# Patient Record
Sex: Male | Born: 1940 | Race: Black or African American | Hispanic: No
Health system: Southern US, Community
[De-identification: ages and names within clinical notes are randomized; demographics above are authoritative.]

## PROBLEM LIST (undated history)

## (undated) DIAGNOSIS — M48061 Spinal stenosis, lumbar region without neurogenic claudication: Secondary | ICD-10-CM

## (undated) DIAGNOSIS — E785 Hyperlipidemia, unspecified: Secondary | ICD-10-CM

## (undated) DIAGNOSIS — D649 Anemia, unspecified: Secondary | ICD-10-CM

## (undated) DIAGNOSIS — C450 Mesothelioma of pleura: Secondary | ICD-10-CM

## (undated) DIAGNOSIS — I10 Essential (primary) hypertension: Secondary | ICD-10-CM

## (undated) HISTORY — PX: HIP SURGERY: SHX245

## (undated) HISTORY — DX: Hyperlipidemia, unspecified: E78.5

## (undated) HISTORY — DX: Mesothelioma of pleura: C45.0

## (undated) HISTORY — DX: Anemia, unspecified: D64.9

## (undated) HISTORY — DX: Essential (primary) hypertension: I10

## (undated) HISTORY — DX: Spinal stenosis, lumbar region without neurogenic claudication: M48.061

---

## 2015-06-27 ENCOUNTER — Encounter: Payer: Self-pay | Admitting: Internal Medicine

## 2015-06-27 ENCOUNTER — Non-Acute Institutional Stay (SKILLED_NURSING_FACILITY): Payer: Medicare Other | Admitting: Internal Medicine

## 2015-06-27 DIAGNOSIS — D62 Acute posthemorrhagic anemia: Secondary | ICD-10-CM | POA: Diagnosis not present

## 2015-06-27 DIAGNOSIS — C450 Mesothelioma of pleura: Secondary | ICD-10-CM

## 2015-06-27 DIAGNOSIS — E785 Hyperlipidemia, unspecified: Secondary | ICD-10-CM | POA: Insufficient documentation

## 2015-06-27 DIAGNOSIS — M48061 Spinal stenosis, lumbar region without neurogenic claudication: Secondary | ICD-10-CM

## 2015-06-27 DIAGNOSIS — I10 Essential (primary) hypertension: Secondary | ICD-10-CM | POA: Insufficient documentation

## 2015-06-27 DIAGNOSIS — M4806 Spinal stenosis, lumbar region: Secondary | ICD-10-CM | POA: Diagnosis not present

## 2015-06-27 DIAGNOSIS — K920 Hematemesis: Secondary | ICD-10-CM | POA: Diagnosis not present

## 2015-06-27 DIAGNOSIS — J910 Malignant pleural effusion: Secondary | ICD-10-CM

## 2015-06-27 NOTE — Progress Notes (Signed)
MRN: [id number] Name: Harold Daniels  Sex: male Age: 74 y.o. DOB: 04-Aug-1941  [place] #: [name] Facility/Room:108 Level Of Care: SNF Provider: Inocencio Homes D Emergency Contacts: Extended Emergency Contact Information Primary Emergency Contact: Contact,No  [place] Phone: [phone] Relation: None  Code Status:   Allergies: Review of patient's allergies indicates not on file.  Chief Complaint  Patient presents with  . New Admit To SNF    HPI: Patient is 74 y.o. male who has metastatic mesothelioma who  is admitted to SNF for OT/PT for lower extremity weakness and who was just hosp for hematemesis with neg w/u.  Past Medical History  Diagnosis Date  . Mesothelioma (pleural)   . Lumbar spinal stenosis   . Anemia   . Hyperlipidemia   . Hypertension     History reviewed. No pertinent past surgical history.    Medication List       This list is accurate as of: 06/27/15  7:16 PM.  Always use your most recent med list.               acetaminophen 650 MG CR tablet  Commonly known as:  TYLENOL  Take 650 mg by mouth every 8 (eight) hours as needed for pain.     atorvastatin 40 MG tablet  Commonly known as:  LIPITOR  Take 40 mg by mouth daily.     docusate sodium 100 MG capsule  Commonly known as:  COLACE  Take 100 mg by mouth every other day.     ENSURE PLUS Liqd  Take 237 mLs by mouth 2 (two) times daily between meals.     folic acid 1 MG tablet  Commonly known as:  FOLVITE  Take 1 mg by mouth daily.     mirtazapine 7.5 MG tablet  Commonly known as:  REMERON  Take 7.5 mg by mouth at bedtime.     multivitamin tablet  Take 1 tablet by mouth daily.     oxycodone 5 MG capsule  Commonly known as:  OXY-IR  Take 5 mg by mouth every 4 (four) hours as needed.     pantoprazole 40 MG tablet  Commonly known as:  PROTONIX  Take 40 mg by mouth daily.        Meds ordered this encounter  Medications  . pantoprazole (PROTONIX) 40 MG  tablet    Sig: Take 40 mg by mouth daily.  Marland Kitchen oxycodone (OXY-IR) 5 MG capsule    Sig: Take 5 mg by mouth every 4 (four) hours as needed.  Marland Kitchen acetaminophen (TYLENOL) 650 MG CR tablet    Sig: Take 650 mg by mouth every 8 (eight) hours as needed for pain.  Marland Kitchen atorvastatin (LIPITOR) 40 MG tablet    Sig: Take 40 mg by mouth daily.  Marland Kitchen docusate sodium (COLACE) 100 MG capsule    Sig: Take 100 mg by mouth every other day.  . folic acid (FOLVITE) 1 MG tablet    Sig: Take 1 mg by mouth daily.  Marland Kitchen ENSURE PLUS (ENSURE PLUS) LIQD    Sig: Take 237 mLs by mouth 2 (two) times daily between meals.  . mirtazapine (REMERON) 7.5 MG tablet    Sig: Take 7.5 mg by mouth at bedtime.  . Multiple Vitamin (MULTIVITAMIN) tablet    Sig: Take 1 tablet by mouth daily.     There is no immunization history on file for this patient.  History  Substance Use Topics  . Smoking status: Unknown If  Ever Smoked  . Smokeless tobacco: Not on file  . Alcohol Use: Not on file    Family history is noncontributory    Review of Systems  DATA OBTAINED: from patient, nurse GENERAL:  no fevers, fatigue, appetite changes SKIN: No itching, rash or wounds EYES: No eye pain, redness, discharge EARS: No earache, tinnitus, change in hearing NOSE: No congestion, drainage or bleeding  MOUTH/THROAT: No mouth or tooth pain, No sore throat RESPIRATORY: No cough, wheezing, SOB CARDIAC: No chest pain, palpitations, lower extremity edema  GI: No abdominal pain, No N/V/D or constipation, No heartburn or reflux  GU: No dysuria, frequency or urgency, or incontinence  MUSCULOSKELETAL: No unrelieved bone/joint pain NEUROLOGIC: No headache, dizziness or focal weakness PSYCHIATRIC: No overt anxiety or sadness, No behavior issue.   Filed Vitals:   06/27/15 1847  BP: 126/72  Pulse: 71  Temp: 97.1 F (36.2 C)  Resp: 18    Physical Exam  GENERAL APPEARANCE: Alert, conversant, BM, No acute distress.  SKIN: No diaphoresis rash HEAD:  Normocephalic, atraumatic  EYES: Conjunctiva/lids clear. Pupils round, reactive. EOMs intact.  EARS: External exam WNL, canals clear. Hearing grossly normal.  NOSE: No deformity or discharge.  MOUTH/THROAT: Lips w/o lesions  RESPIRATORY: Breathing is even, unlabored. Lung sounds are clear but shallow CARDIOVASCULAR: Heart RRR no murmurs, rubs or gallops. No peripheral edema.   GASTROINTESTINAL: Abdomen is soft, non-tender, not distended w/ normal bowel sounds. GENITOURINARY: Bladder non tender, not distended  MUSCULOSKELETAL: No abnormal joints or musculature NEUROLOGIC:  Cranial nerves 2-12 grossly intact. Moves all extremities  PSYCHIATRIC: Mood and affect appropriate to situation, no behavioral issues  Patient Active Problem List   Diagnosis Date Noted  . Malignant pleural effusion 06/27/2015  . Hematemesis 06/27/2015  . Mesothelioma (pleural)   . Lumbar spinal stenosis   . Acute blood loss anemia   . Hyperlipidemia   . Hypertension         Assessment and Plan  Mesothelioma (pleural) METASTATIC; CT chest/abd/pelvis suggested advancing disease; chronically indwelling chest tube to drain recurrent malignant pleural effusion; pt has asked for care be transferred to 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

## 2015-06-27 NOTE — Assessment & Plan Note (Addendum)
METASTATIC; CT chest/abd/pelvis suggested advancing disease; chronically indwelling chest tube to drain recurrent malignant pleural effusion; pt has asked for care be transferred to [facility] and has f/u with Dr Mindi Junker

## 2015-06-27 NOTE — Assessment & Plan Note (Signed)
Neurology evaled 2/2 hx LE weakness and frequent falls; CT - compression fx of T3T4, indeterminant age, and possibly new T3 canal stenosis; Lumbar MRI with severe spinal stenosis at [facility]; he was treated with tylenol and PT

## 2015-06-27 NOTE — Assessment & Plan Note (Signed)
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

## 2015-06-27 NOTE — Assessment & Plan Note (Addendum)
2/2 hematemesis;Pt Hb dropped from 13 to mid 10's; there was sign of bleeding found;pt was placed on PPI

## 2015-06-27 NOTE — Assessment & Plan Note (Signed)
Continue lipitor  ?

## 2015-07-26 ENCOUNTER — Encounter: Payer: Self-pay | Admitting: Internal Medicine

## 2015-07-26 ENCOUNTER — Non-Acute Institutional Stay (SKILLED_NURSING_FACILITY): Payer: Medicare Other | Admitting: Internal Medicine

## 2015-07-26 DIAGNOSIS — C450 Mesothelioma of pleura: Secondary | ICD-10-CM | POA: Diagnosis not present

## 2015-07-26 DIAGNOSIS — D62 Acute posthemorrhagic anemia: Secondary | ICD-10-CM

## 2015-07-26 DIAGNOSIS — J910 Malignant pleural effusion: Secondary | ICD-10-CM

## 2015-07-26 NOTE — Progress Notes (Signed)
Patient ID: Harold Daniels, male   DOB: Apr 21, 1941, 74 y.o.   MRN: [id number] MRN: [id number] Name: Harold Daniels  Sex: male Age: 74 y.o. DOB: Apr 07, 1941  [place] [facility] Facility/Room:108 Level Of Care: SNF Provider: Wille Celeste Emergency Contacts: Extended Emergency Contact Information Primary Emergency Contact: Contact,No  [place] Phone: [phone] Relation: None  Code Status:   Allergies: Review of patient's allergies indicates not on file.  Chief Complaint  Patient presents with  . Discharge Note    HPI: Patient is 74 y.o. male who has metastatic mesothelioma who  is admitted to SNF for OT/PT for lower extremity weakness and who was just hosp for hematemesis with neg w/u.--His stay here has been relatively unremarkable he has no complaints today he is slated to go home-he does have a supportive family-today he has no acute complaints He has gained strength but will need continued PT and OT for strengthening  Past Medical History  Diagnosis Date  . Mesothelioma (pleural)   . Lumbar spinal stenosis   . Anemia   . Hyperlipidemia   . Hypertension     No past surgical history on file.    Medication List       This list is accurate as of: 07/26/15 11:59 PM.  Always use your most recent med list.               acetaminophen 650 MG CR tablet  Commonly known as:  TYLENOL  Take 650 mg by mouth every 8 (eight) hours as needed for pain.     atorvastatin 40 MG tablet  Commonly known as:  LIPITOR  Take 40 mg by mouth daily.     docusate sodium 100 MG capsule  Commonly known as:  COLACE  Take 100 mg by mouth every other day.     ENSURE PLUS Liqd  Take 237 mLs by mouth 2 (two) times daily between meals.     folic acid 1 MG tablet  Commonly known as:  FOLVITE  Take 1 mg by mouth daily.     mirtazapine 7.5 MG tablet  Commonly known as:  REMERON  Take 7.5 mg by mouth at bedtime.     multivitamin tablet  Take 1 tablet by mouth daily.     oxycodone 5 MG capsule  Commonly known as:  OXY-IR  Take 5 mg by mouth every 4 (four) hours as needed.     pantoprazole 40 MG tablet  Commonly known as:  PROTONIX  Take 40 mg by mouth daily.            Social History  Substance Use Topics  . Smoking status: Unknown If Ever Smoked  . Smokeless tobacco: Not on file  . Alcohol Use: Not on file    Family history is noncontributory    Review of Systems  DATA OBTAINED: from patient, nurse GENERAL:  no fevers, fatigue, appetite changes SKIN: No itching, rash or wounds EYES: No eye pain, redness, discharge EARS: No earache, tinnitus, change in hearing NOSE: No congestion, drainage or bleeding  MOUTH/THROAT: No mouth or tooth pain, No sore throat RESPIRATORY: No cough, wheezing, SOB CARDIAC: No chest pain, palpitations, lower extremity edema  GI: No abdominal pain, No N/V/D or constipation, No heartburn or reflux  GU: No dysuria, frequency or urgency, or incontinence  MUSCULOSKELETAL: No unrelieved bone/joint pain--has some continued weakness NEUROLOGIC: No headache, dizziness or focal weakness PSYCHIATRIC: No overt anxiety or sadness, No behavior issue.   Filed Vitals:  07/26/15 1731  BP: 100/65  Pulse: 56  Temp: 97.2 F (36.2 C)  Resp: 18    Physical Exam  GENERAL APPEARANCE: Alert, conversant, BM, No acute distress.  SKIN: No diaphoresis rash HEAD: Normocephalic, atraumatic  EYES: Conjunctiva/lids clear. Pupils round, reactive. EOMs intact.  EARS: External exam WNL, canals clear. Hearing grossly normal.  NOSE: No deformity or discharge.  MOUTH/THROAT: Oropharynx is clear mucous membranes moist  RESPIRATORY: Breathing is even, unlabored. Lung sounds are clear but shallow CARDIOVASCULAR: Heart RRR with occasional skipped beats no murmurs, rubs or gallops. No peripheral edema.   GASTROINTESTINAL: Abdomen is soft, non-tender, not distended w/ normal bowel sounds. GENITOURINARY: Bladder non tender, not distended   MUSCULOSKELETAL: No abnormal joints or musculature. Does ambulate with a walker but is quite weak he is kyphotic NEUROLOGIC:  Cranial nerves 2-12 grossly intact. Moves all extremities  PSYCHIATRIC: Mood and affect appropriate to situation, no behavioral issues  Patient Active Problem List   Diagnosis Date Noted  . Malignant pleural effusion 06/27/2015  . Hematemesis 06/27/2015  . Mesothelioma (pleural)   . Lumbar spinal stenosis   . Acute blood loss anemia   . Hyperlipidemia   . Hypertension     Labs  06/19/2015.  Sodium 139 potassium 3.9 BUN 12 creatinine 1.1.  CBC 3.6 hemoglobin 10.7 platelets 233.  06/13/2015.  Sodium 138 potassium 4 BUN 22 creatinine 1.28-liver function tests within normal limits.  WBC 7.9 hemoglobin 12.4 platelets 208.      Assessment and Plan  Mesothelioma-CT of the chest abdomen and pelvis suggested advancing disease-chronically indwelling chest tube to drain recurrent malignant pleural effusion-he has follow-up at [facility].  Lumbar spinal stenosis evaluated by neurology with a history of lower extremity weakness and falls-does have a compression fracture of T3-T4 of indeterminate age and possibly new T3 canal stenosis-lumbar MRI also showed severe spinal stenosis at L2 and L3-Ms. 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  Acute blood loss anemia again hemoglobin appears to have stabilized on iron because noted above.  Patient wil he will need PT and OT-also home health to draw CBC and BMP  notify  nprimary care provider of results-would like to make sure his  hemoglobin is stable-clinically appears to be stable.  [id number] note greater than 30 minutes spent on this discharge summary- [name],

## 2015-09-01 ENCOUNTER — Other Ambulatory Visit: Payer: Self-pay | Admitting: Internal Medicine

## 2015-11-08 ENCOUNTER — Encounter: Payer: Self-pay | Admitting: Internal Medicine

## 2015-11-08 ENCOUNTER — Non-Acute Institutional Stay (SKILLED_NURSING_FACILITY): Payer: Medicare Other | Admitting: Internal Medicine

## 2015-11-08 DIAGNOSIS — K219 Gastro-esophageal reflux disease without esophagitis: Secondary | ICD-10-CM | POA: Diagnosis not present

## 2015-11-08 DIAGNOSIS — K913 Postprocedural intestinal obstruction: Secondary | ICD-10-CM

## 2015-11-08 DIAGNOSIS — K567 Ileus, unspecified: Secondary | ICD-10-CM | POA: Insufficient documentation

## 2015-11-08 DIAGNOSIS — K9189 Other postprocedural complications and disorders of digestive system: Secondary | ICD-10-CM

## 2015-11-08 DIAGNOSIS — I441 Atrioventricular block, second degree: Secondary | ICD-10-CM | POA: Insufficient documentation

## 2015-11-08 DIAGNOSIS — N400 Enlarged prostate without lower urinary tract symptoms: Secondary | ICD-10-CM | POA: Insufficient documentation

## 2015-11-08 DIAGNOSIS — E034 Atrophy of thyroid (acquired): Secondary | ICD-10-CM

## 2015-11-08 DIAGNOSIS — E039 Hypothyroidism, unspecified: Secondary | ICD-10-CM | POA: Insufficient documentation

## 2015-11-08 DIAGNOSIS — E785 Hyperlipidemia, unspecified: Secondary | ICD-10-CM

## 2015-11-08 DIAGNOSIS — I4891 Unspecified atrial fibrillation: Secondary | ICD-10-CM | POA: Diagnosis not present

## 2015-11-08 DIAGNOSIS — I639 Cerebral infarction, unspecified: Secondary | ICD-10-CM | POA: Diagnosis not present

## 2015-11-08 DIAGNOSIS — S72141D Displaced intertrochanteric fracture of right femur, subsequent encounter for closed fracture with routine healing: Secondary | ICD-10-CM

## 2015-11-08 DIAGNOSIS — E038 Other specified hypothyroidism: Secondary | ICD-10-CM | POA: Diagnosis not present

## 2015-11-08 DIAGNOSIS — S72141A Displaced intertrochanteric fracture of right femur, initial encounter for closed fracture: Secondary | ICD-10-CM | POA: Insufficient documentation

## 2015-11-08 NOTE — Assessment & Plan Note (Signed)
SNF - stable, cont lipitor 40 mg daily

## 2015-11-08 NOTE — Assessment & Plan Note (Signed)
Presented during hospitalization for hip with dysarthria and R facial weakness, MRI wiith multifocal stroke; CTA head- no sig stenosis,carotid-no sig stenosis; then onset AFib and coumadin started; SNF - ot /pt for stroke, titrate coumadin actively with PT/INR's; INR at d/c 2.36

## 2015-11-08 NOTE — Assessment & Plan Note (Signed)
Anti-coag on 3 mg coumadin with d/c INR of 2.36; SNF - actively titrate coumadin, f/u CBC

## 2015-11-08 NOTE — Progress Notes (Signed)
MRN: [id number] Name: Harold Daniels  Sex: male Age: 74 y.o. DOB: December 07, 1941  [place] [name] Facility/Room:506 Level Of Care: SNF Provider: Inocencio Homes D Emergency Contacts: Extended Emergency Contact Information Primary Emergency Contact: Contact,No  [place] Phone: [phone] Relation: None  Code Status:   Allergies: Review of patient's allergies indicates not on file.  Chief Complaint  Patient presents with  . New Admit To SNF    HPI: Patient is 74 y.o. male s/p mesothelioma, tx, HLD, and HTN who had a mechanical fall and broke his R hip. He was hospitalized at [facility] from 10/22-11/11 where he had hip repair but subsequently had a stroke, presenting with dysarthria and R facial droop, then developed secondary heart block with bradycardia and A fib. These were treated with a pacemaker and anticoagulated with coumadin.Hospital course was also complicated by an ileus and new dx hypothyroidism. Pt is now being admitted to SNF with generalized weakness and for OT/PT for his hip fx. While at SNF pt will be followed for BPH, tx with proscar, HLD, tx with lipitor and GERD, tx with protonix.  Past Medical History  Diagnosis Date  . Mesothelioma (pleural) ([place])   . Lumbar spinal stenosis   . Anemia   . Hyperlipidemia   . Hypertension     History reviewed. No pertinent past surgical history.    Medication List       This list is accurate as of: 11/08/15  8:51 PM.  Always use your most recent med list.               acetaminophen 650 MG CR tablet  Commonly known as:  TYLENOL  Take 650 mg by mouth every 8 (eight) hours as needed for pain.     aspirin 81 MG tablet  Take 81 mg by mouth daily.     atorvastatin 40 MG tablet  Commonly known as:  LIPITOR  Take 40 mg by mouth daily.     docusate sodium 100 MG capsule  Commonly known as:  COLACE  Take 100 mg by mouth every other day.     ENSURE PLUS Liqd  Take 237 mLs by mouth 2 (two) times  daily between meals.     finasteride 5 MG tablet  Commonly known as:  PROSCAR  Take 5 mg by mouth daily.     folic acid 1 MG tablet  Commonly known as:  FOLVITE  Take 1 mg by mouth daily.     levothyroxine 25 MCG tablet  Commonly known as:  SYNTHROID, LEVOTHROID  Take 25 mcg by mouth daily before breakfast.     mirtazapine 7.5 MG tablet  Commonly known as:  REMERON  Take 7.5 mg by mouth at bedtime.     multivitamin tablet  Take 1 tablet by mouth daily.     pantoprazole 40 MG tablet  Commonly known as:  PROTONIX  Take 40 mg by mouth daily.     sennosides-docusate sodium 8.6-50 MG tablet  Commonly known as:  SENOKOT-S  Take 2 tablets by mouth 2 (two) times daily.     traMADol 50 MG tablet  Commonly known as:  ULTRAM  Take 50 mg by mouth every 8 (eight) hours as needed.     warfarin 3 MG tablet  Commonly known as:  COUMADIN  Take 3 mg by mouth daily.        Meds ordered this encounter  Medications  . finasteride (PROSCAR) 5 MG tablet    Sig:  Take 5 mg by mouth daily.  Marland Kitchen warfarin (COUMADIN) 3 MG tablet    Sig: Take 3 mg by mouth daily.  . traMADol (ULTRAM) 50 MG tablet    Sig: Take 50 mg by mouth every 8 (eight) hours as needed.  . sennosides-docusate sodium (SENOKOT-S) 8.6-50 MG tablet    Sig: Take 2 tablets by mouth 2 (two) times daily.  Marland Kitchen levothyroxine (SYNTHROID, LEVOTHROID) 25 MCG tablet    Sig: Take 25 mcg by mouth daily before breakfast.  . aspirin 81 MG tablet    Sig: Take 81 mg by mouth daily.     There is no immunization history on file for this patient.  Social History  Substance Use Topics  . Smoking status: Unknown If Ever Smoked  . Smokeless tobacco: Not on file  . Alcohol Use: Not on file    Family history is + DM2. Breast CA   Review of Systems  DATA OBTAINED: from patient, nurse GENERAL:  no fevers, fatigue, appetite changes SKIN: No itching, rash or wounds EYES: No eye pain, redness, discharge EARS: No earache, tinnitus, change  in hearing NOSE: No congestion, drainage or bleeding  MOUTH/THROAT: No mouth or tooth pain, No sore throat RESPIRATORY: No cough, wheezing, SOB CARDIAC: No chest pain, palpitations, lower extremity edema  GI: No abdominal pain, No N/V/D or constipation, No heartburn or reflux  GU: No dysuria, frequency or urgency, or incontinence  MUSCULOSKELETAL: No unrelieved bone/joint pain NEUROLOGIC: No headache, dizziness or focal weakness PSYCHIATRIC: No c/o anxiety or sadness   Filed Vitals:   11/08/15 2025  BP: 111/70  Pulse: 50  Temp: 97.8 F (36.6 C)  Resp: 18    SpO2 Readings from Last 1 Encounters:  No data found for SpO2     Physical Exam  GENERAL APPEARANCE: Alert, conversant,  BM No acute distress.  SKIN: No diaphoresis rash; no redness or heat to incision site HEAD: Normocephalic, atraumatic  EYES: Conjunctiva/lids clear. Pupils round, reactive. EOMs intact.  EARS: External exam WNL, canals clear. Hearing grossly normal.  NOSE: No deformity or discharge.  MOUTH/THROAT: Lips w/o lesions  RESPIRATORY: Breathing is even, unlabored. Lung sounds are clear   CARDIOVASCULAR: Heart RRR no murmurs, rubs or gallops. No peripheral edema.   GASTROINTESTINAL: Abdomen is soft, non-tender, not distended w/ normal bowel sounds. GENITOURINARY: Bladder non tender, not distended  MUSCULOSKELETAL: No abnormal joints or musculature NEUROLOGIC:  Cranial nerves 2-12 grossly intact. Moves all extremities  PSYCHIATRIC: Mood and affect appropriate to situation, no behavioral issues  Patient Active Problem List   Diagnosis Date Noted  . Intertrochanteric fracture of right hip ([place]) 11/08/2015  . Acute thromboembolic cerebrovascular accident (CVA) ([place]) 11/08/2015  . Atrial fibrillation, new onset ([place]) 11/08/2015  . Hypothyroidism 11/08/2015  . BPH (benign prostatic hypertrophy) 11/08/2015  . Ileus, postoperative 11/08/2015  . Heart block AV second degree 11/08/2015  . GERD (gastroesophageal  reflux disease) 11/08/2015  . Malignant pleural effusion 06/27/2015  . Hematemesis 06/27/2015  . Mesothelioma (pleural) ([place])   . Lumbar spinal stenosis   . Acute blood loss anemia   . Hyperlipidemia   . Hypertension     CBC No results found for: WBC, RBC, HGB, HCT, PLT, MCV, LYMPHSABS, MONOABS, EOSABS, BASOSABS  CMP  No results found for: NA, K, CL, CO2, GLUCOSE, BUN, CREATININE, CALCIUM, PROT, ALBUMIN, AST, ALT, ALKPHOS, BILITOT, GFRNONAA, GFRAA  No results found for: HGBA1C     Not all labs, radiology exams or other studies done during hospitalization come  through on my EPIC note; however they are reviewed by me.    Assessment and Plan  Intertrochanteric fracture of right hip [facility]) S/p surgical repair; SNF - for OT/PT, coumadin for AF being used as prophylaxis  Acute thromboembolic cerebrovascular accident (CVA) ([place]) Presented during hospitalization for hip with dysarthria and R facial weakness, MRI wiith multifocal stroke; CTA head- no sig stenosis,carotid-no sig stenosis; then onset AFib and coumadin started; SNF - ot /pt for stroke, titrate coumadin actively with PT/INR's; INR at d/c 2.36  Atrial fibrillation, new onset ([facility]) Anti-coag on 3 mg coumadin with d/c INR of 2.36; SNF - actively titrate coumadin, f/u CBC  Hypothyroidism Discovered during CVA w/u, started on synthroid; SNF - cont synthroid 25 mcg with TSH 6 weeks  BPH (benign prostatic hypertrophy) SNF - stable; cont proscar 5 mg daily  Ileus, postoperative Resolved with aggressive bowel regimen  Heart block AV second degree Presented as bradycardia , with PAC, with concern for a fib;pacemaker was placed  Hyperlipidemia SNF - stable, cont lipitor 40 mg daily  GERD (gastroesophageal reflux disease) SNF - chronic and stable on protonix 40 mg daily   Time spent > 45 mon;> 50% of time with patient was spent reviewing records, labs, tests and studies, counseling and developing plan of  care  Hennie Duos, MD

## 2015-11-08 NOTE — Assessment & Plan Note (Signed)
Resolved with aggressive bowel regimen

## 2015-11-08 NOTE — Assessment & Plan Note (Addendum)
Presented as bradycardia , with PAC, with concern for a fib;pacemaker was placed

## 2015-11-08 NOTE — Assessment & Plan Note (Signed)
Discovered during CVA w/u, started on synthroid; SNF - cont synthroid 25 mcg with TSH 6 weeks

## 2015-11-08 NOTE — Assessment & Plan Note (Signed)
SNF - stable; cont proscar 5 mg daily

## 2015-11-08 NOTE — Assessment & Plan Note (Signed)
S/p surgical repair; SNF - for OT/PT, coumadin for AF being used as prophylaxis

## 2015-11-08 NOTE — Assessment & Plan Note (Signed)
SNF - chronic and stable on protonix 40 mg daily

## 2015-12-15 ENCOUNTER — Non-Acute Institutional Stay (SKILLED_NURSING_FACILITY): Payer: Medicare Other | Admitting: Internal Medicine

## 2015-12-15 ENCOUNTER — Encounter: Payer: Self-pay | Admitting: Internal Medicine

## 2015-12-15 DIAGNOSIS — I441 Atrioventricular block, second degree: Secondary | ICD-10-CM | POA: Diagnosis not present

## 2015-12-15 DIAGNOSIS — I4891 Unspecified atrial fibrillation: Secondary | ICD-10-CM

## 2015-12-15 DIAGNOSIS — E038 Other specified hypothyroidism: Secondary | ICD-10-CM | POA: Diagnosis not present

## 2015-12-15 DIAGNOSIS — S72141D Displaced intertrochanteric fracture of right femur, subsequent encounter for closed fracture with routine healing: Secondary | ICD-10-CM | POA: Diagnosis not present

## 2015-12-15 DIAGNOSIS — I639 Cerebral infarction, unspecified: Secondary | ICD-10-CM | POA: Diagnosis not present

## 2015-12-15 NOTE — Progress Notes (Addendum)
Patient ID: Harold Daniels, male   DOB: Apr 25, 1941, 74 y.o.   MRN: [id number] MRN: [id number] Name: Malekai Carle  Sex: male Age: 74 y.o. DOB: Feb 20, 1941  [place] #: [name] Facility/Room:506 Level Of Care: SNF Provider: Wille Celeste Emergency Contacts: Extended Emergency Contact Information Primary Emergency Contact: Contact,No  [place] Phone: [phone] Relation: None  Code Status:   Allergies: Review of patient's allergies indicates not on file.  Chief Complaint  Patient presents with  . Discharge Note    HPI: Patient is 74 y.o. male s/p mesothelioma, tx, HLD, and HTN who had a mechanical fall and broke his R hip. 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 These were treated with a pacemaker and anticoagulated with coumadin.Hospital course was also complicated by an ileus and new dx hypothyroidism. Pt was now being admitted to SNF with generalized weakness and for OT/PT for his hip fx. While at SNF pt w followed for BPH, tx with proscar, HLD, tx with lipitor and GERD, tx with protonix. Apparently he has gotten somewhat stronger with therapy here continues to be quite weak.  Pt to be d/c to ALF with HH/OT/PT/CNA.  Patient himself appears to be stable although somewhat weak-vital signs have been stable his medical conditions appear to have stabilized.      Past Medical History  Diagnosis Date  . Mesothelioma (pleural) ([place])   . Lumbar spinal stenosis   . Anemia   . Hyperlipidemia   . Hypertension     No past surgical history on file.    Medication List       This list is accurate as of: 12/15/15 11:59 PM.  Always use your most recent med list.               acetaminophen 650 MG CR tablet  Commonly known as:  TYLENOL  Take 650 mg by mouth every 8 (eight) hours as needed  for pain.     atorvastatin 40 MG tablet  Commonly known as:  LIPITOR  Take 40 mg by mouth daily.     docusate sodium 100 MG capsule  Commonly known as:  COLACE  Take 100 mg by mouth every other day.     ENSURE PLUS Liqd  Take 237 mLs by mouth 2 (two) times daily between meals.     finasteride 5 MG tablet  Commonly known as:  PROSCAR  Take 5 mg by mouth daily.     folic acid 1 MG tablet  Commonly known as:  FOLVITE  Take 1 mg by mouth daily.     levothyroxine 25 MCG tablet  Commonly known as:  SYNTHROID, LEVOTHROID  Take 25 mcg by mouth daily before breakfast.     mirtazapine 7.5 MG tablet  Commonly known as:  REMERON  Take 7.5 mg by mouth at bedtime.     multivitamin tablet  Take 1 tablet by mouth daily.     pantoprazole 40 MG tablet  Commonly known as:  PROTONIX  Take 40 mg by mouth daily.     sennosides-docusate sodium 8.6-50 MG tablet  Commonly known as:  SENOKOT-S  Take 2 tablets by mouth 2 (two) times daily.     traMADol 50 MG tablet  Commonly known as:  ULTRAM  Take 50 mg by mouth every 8 (eight) hours as needed.  warfarin 3 MG tablet  Commonly known as:  COUMADIN  Take 5 mg by mouth daily.        No orders of the defined types were placed in this encounter.     There is no immunization history on file for this patient.  Social History  Substance Use Topics  . Smoking status: Unknown If Ever Smoked  . Smokeless tobacco: Not on file  . Alcohol Use: Not on file    Family history is + DM2. Breast CA   Review of Systems  DATA OBTAINED: from patient, nurse-brother GENERAL:  no fevers, fatigue, appetite changes SKIN: No itching, rash or wounds EYES: No eye pain, redness, discharge EARS: No earache, tinnitus, change in hearing NOSE: No congestion, drainage or bleeding  MOUTH/THROAT: No mouth or tooth pain, No sore throat RESPIRATORY: No cough, wheezing, SOB CARDIAC: No chest pain, palpitations, lower extremity edema  GI: No abdominal  pain, No N/V/D or constipation, No heartburn or reflux  GU: No dysuria, frequency or urgency, or incontinence  MUSCULOSKELETAL: No unrelieved bone/joint pain but continues to be quite weak NEUROLOGIC: No headache, dizziness or focal weakness--history of CVA with what appears to be more generalized weakness PSYCHIATRIC: No c/o anxiety or sadness   Filed Vitals:   12/15/15 1353  BP: 130/73  Pulse: 60  Temp: 97.1 F (36.2 C)  Resp: 20    SpO2 Readings from Last 1 Encounters:  No data found for SpO2     Physical Exam  GENERAL APPEARANCE: Alert, conversant,  BM No acute distress appears to be quite frail.  SKIN: No diaphoresis rash; no redness or heat to incision site HEAD: Normocephalic, atraumatic  EYES: Conjunctiva/lids clear. Pupils round, reactive. EOMs intact.  EARS: External exam WNL, canals clear. Hearing grossly normal.  NOSE: No deformity or discharge.  MOUTH/THROAT: Oropharynx clear mucous membranes moist she has numerous extractions  RESPIRATORY: Breathing is even, unlabored. Lung sounds are clear --shallow air entry  CARDIOVASCULAR: Heart RRR with some irregular beats no murmurs, rubs or gallops. No peripheral edema.   GASTROINTESTINAL: Abdomen is soft, non-tender, not distended w/ normal bowel sounds.  MUSCULOSKELETAL: No abnormal joints or musculature--generalized frailty-is quite weak this does not appear able to stand without assistance--uses a walker with assistance NEUROLOGIC:  Cranial nerves 2-12 grossly intact. Moves all extremities  PSYCHIATRIC: Mood and affect appropriate to situation, no behavioral issues  Patient Active Problem List   Diagnosis Date Noted  . Intertrochanteric fracture of right hip ([place]) 11/08/2015  . Acute thromboembolic cerebrovascular accident (CVA) ([place]) 11/08/2015  . Atrial fibrillation, new onset ([place]) 11/08/2015  . Hypothyroidism 11/08/2015  . BPH (benign prostatic hypertrophy) 11/08/2015  . Ileus, postoperative 11/08/2015  .  Heart block AV second degree 11/08/2015  . GERD (gastroesophageal reflux disease) 11/08/2015  . Malignant pleural effusion 06/27/2015  . Hematemesis 06/27/2015  . Mesothelioma (pleural) ([place])   . Lumbar spinal stenosis   . Acute blood loss anemia   . Hyperlipidemia   . Hypertension    10/15/2015.  WBC 11.6 hemoglobin 12.7 platelets 202.  Sodium 137 potassium 4.4 BUN 17 creatinine 1.26.  TSH was 9.231 in the hospital     Assessment and Plan  Pt to be d/c to ALF with HH/OT/PT/CNA.  History of right hip fracture-status post surgical repair-if patient is discharged he will need continued PT and OT for strengthening as well as CNA support to help with his activities of daily living he still have significant weakness-again this will have to be addressed  next week whether patient will be discharged home or whether he will be staying a bit longer to gain some strength-again his brother is concerned per conversation with him today-this was discussed with social work as well.  There are insurance issues apparently involved with the discharge-.  #2 history of acute thromboembolic CVA-presented with dysarthria and right facial weakness.  MRI showed multifocal stroke-no significant carotid stenosis-new onset A. fib.  Coumadin has been started INR is being monitored update INR is pending-this will have to be followed closely by home health with primary care provider notified of results for management of Coumadin.  Atrial fibrillation this appears to be rate controlled on Coumadin will update a CBC.  Hypothyroidism this was discovered during the CVA workup-he has been started on Synthroid we will need a TSH within the next few weeks Synthroid was just recently started in the hospital.  History of BPH this appears stable on Proscar 5 mg a day.  History of ileus postop-this apparently has resolved.  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  Hyperlipidemia this is thought to be stable he is on Lipitor 40 mg a day.  History of GERD he is on Protonix this appears not to have been an issue recently.  Again patient's discharge now is  tentative this will have to be sorted out between social work his brother and support staff-will update a CBC metabolic panel and TSH for updated values.  Update INR also is pending.  If patient is discharged again he will need extensive support as noted above with bedside commode shower bench PT and OT as well as extensive CNA support.  [id number] note greater than 30 minutes spent on this discharge summary-greater than 50% of time spent coordinating plan of care with discussion with social worker as well as with his brother at bedside-prescriptions have been written

## 2015-12-22 ENCOUNTER — Non-Acute Institutional Stay (SKILLED_NURSING_FACILITY): Payer: Medicare Other | Admitting: Internal Medicine

## 2015-12-22 ENCOUNTER — Encounter: Payer: Self-pay | Admitting: Internal Medicine

## 2015-12-22 DIAGNOSIS — C459 Mesothelioma, unspecified: Secondary | ICD-10-CM

## 2015-12-22 DIAGNOSIS — S72141D Displaced intertrochanteric fracture of right femur, subsequent encounter for closed fracture with routine healing: Secondary | ICD-10-CM

## 2015-12-22 DIAGNOSIS — I4891 Unspecified atrial fibrillation: Secondary | ICD-10-CM | POA: Diagnosis not present

## 2015-12-22 DIAGNOSIS — R531 Weakness: Secondary | ICD-10-CM | POA: Diagnosis not present

## 2015-12-22 NOTE — Progress Notes (Signed)
Patient ID: Harold Daniels, male   DOB: [birth year], 74 y.o.   MRN: [id number]  MRN: [id number] Name: Harold Daniels  Sex: male Age: 74 y.o. DOB: Mar 22, 1941  [place] #: [name] Facility/Room:506 Level Of Care: SNF Provider: Wille Celeste Emergency Contacts: Extended Emergency Contact Information Primary Emergency Contact: Contact,No  [place] Phone: [phone] Relation: None  Code Status:   Allergies: Review of patient's allergies indicates not on file.  Chief Complaint  Patient presents with  . Acute Visit    HPI: Patient is 74 y.o. male s/p mesothelioma, tx, HLD, and HTN who had a mechanical fall and broke his R hip. He was hospitalized at [facility] from 10/22-11/11 where he had hip repair but subsequently had a stroke, presenting with dysarthria and R facial droop, then developed secondary heart block with bradycardia and A fib. These were treated with a pacemaker and anticoagulated with coumadin.Hospital course was also complicated by an ileus and new dx hypothyroidism. Pt was now being admitted to SNF with generalized weakness and for OT/PT for his hip fx. While at SNF pt w followed for BPH, tx with proscar, HLD, tx with lipitor and GERD, tx with protonix. Apparently he has gotten somewhat stronger with therapy here continues to be quite weak.   .  Patient himself appears to be stable although somewhat weak-vital signs have been stable his medical conditions appear to have stabilized.  When I saw him last week his brother who he was going to go home with had concerns.  Brother would actually preferred he remained in the facility longer however apparently there are financial and insurance issues.  This has been discussed over the past week with Education officer, museum as well as with other various entities-and patient now will be going home apparently with fairly extensive support he will need PT and OT as well as CNA support.  We will also order a hospice consult  secondary to patient's weak status-- comorbidities and history of mesothelioma with a malignant pleural effusion.  His brother appears to be more comfortable with the situation now although patient will need extensive follow-up      Past Medical History  Diagnosis Date  . Mesothelioma (pleural) ([place])   . Lumbar spinal stenosis   . Anemia   . Hyperlipidemia   . Hypertension     No past surgical history on file.    Medication List       This list is accurate as of: 12/22/15 11:59 PM.  Always use your most recent med list.               acetaminophen 650 MG CR tablet  Commonly known as:  TYLENOL  Take 650 mg by mouth every 8 (eight) hours as needed for pain.     atorvastatin 40 MG tablet  Commonly known as:  LIPITOR  Take 40 mg by mouth daily.     docusate sodium 100 MG capsule  Commonly known as:  COLACE  Take 100 mg by mouth every other day.     ENSURE PLUS Liqd  Take 237 mLs by mouth 2 (two) times daily between meals.     finasteride 5 MG tablet  Commonly known as:  PROSCAR  Take 5 mg by mouth daily.     folic acid 1 MG tablet  Commonly known as:  FOLVITE  Take 1 mg by mouth daily.     levothyroxine 25 MCG tablet  Commonly known as:  SYNTHROID, LEVOTHROID  Take 25  mcg by mouth daily before breakfast.     mirtazapine 7.5 MG tablet  Commonly known as:  REMERON  Take 7.5 mg by mouth at bedtime.     multivitamin tablet  Take 1 tablet by mouth daily.     pantoprazole 40 MG tablet  Commonly known as:  PROTONIX  Take 40 mg by mouth daily.     sennosides-docusate sodium 8.6-50 MG tablet  Commonly known as:  SENOKOT-S  Take 2 tablets by mouth 2 (two) times daily.     traMADol 50 MG tablet  Commonly known as:  ULTRAM  Take 50 mg by mouth every 8 (eight) hours as needed.     warfarin 3 MG tablet  Commonly known as:  COUMADIN  Take 5 mg by mouth daily.        No orders of the defined types were placed in this encounter.     There is no  immunization history on file for this patient.  Social History  Substance Use Topics  . Smoking status: Unknown If Ever Smoked  . Smokeless tobacco: Not on file  . Alcohol Use: Not on file    Family history is + DM2. Breast CA   Review of Systems  DATA OBTAINED: from patient, nurse-brother GENERAL:  no fevers, fatigue, appetite changes SKIN: No itching, rash or wounds EYES: No eye pain, redness, discharge EARS: No earache, tinnitus, change in hearing NOSE: No congestion, drainage or bleeding  MOUTH/THROAT: No mouth or tooth pain, No sore throat RESPIRATORY: No cough, wheezing, SOB CARDIAC: No chest pain, palpitations, lower extremity edema  GI: No abdominal pain, No N/V/D or constipation, No heartburn or reflux  GU: No dysuria, frequency or urgency, or incontinence  MUSCULOSKELETAL: No unrelieved bone/joint pain but continues to be quite weak NEUROLOGIC: No headache, dizziness or focal weakness--history of CVA with what appears to be  generalized weakness PSYCHIATRIC: No c/o anxiety or sadness   Filed Vitals:   12/22/15 1843  BP: 142/66  Pulse: 82  Temp: 97 F (36.1 C)  Resp: 16    SpO2 Readings from Last 1 Encounters:  No data found for SpO2     Physical Exam  GENERAL APPEARANCE: Alert, conversant,  BM No acute distress appears to be quite frail.  SKIN: No diaphoresis rash; n HEAD: Normocephalic, atraumatic  EYES: Conjunctiva/lids clear. Pupils round, reactive. EOMs intact.  EARS: External exam WNL, canals clear. Hearing grossly normal.  NOSE: No deformity or discharge.  MOUTH/THROAT: Oropharynx clear mucous membranes moist she has numerous extractions  RESPIRATORY: Breathing is even, unlabored. Lung sounds are clear --shallow air entry  CARDIOVASCULAR: Heart RRR with some irregular beats no murmurs, rubs or gallops. No peripheral edema.   GASTROINTESTINAL: Abdomen is soft, non-tender, protuberant w/ normal bowel sounds.  MUSCULOSKELETAL: No abnormal joints or  musculature--generalized frailty-is quite weak this does not appear able to stand without assistance--uses a walker with assistance NEUROLOGIC:  Cranial nerves 2-12 grossly intact. Moves all extremities  PSYCHIATRIC: Mood and affect appropriate to situation, no behavioral issues  Patient Active Problem List   Diagnosis Date Noted  . Intertrochanteric fracture of right hip ([place]) 11/08/2015  . Acute thromboembolic cerebrovascular accident (CVA) ([place]) 11/08/2015  . Atrial fibrillation, new onset ([place]) 11/08/2015  . Hypothyroidism 11/08/2015  . BPH (benign prostatic hypertrophy) 11/08/2015  . Ileus, postoperative 11/08/2015  . Heart block AV second degree 11/08/2015  . GERD (gastroesophageal reflux disease) 11/08/2015  . Malignant pleural effusion 06/27/2015  . Hematemesis 06/27/2015  . Mesothelioma (pleural) (  [place])   . Lumbar spinal stenosis   . Acute blood loss anemia   . Hyperlipidemia   . Hypertension     Labs.  Is a 26 2016.  WBC 5.5 hemoglobin 12.2 platelets 218.  TSH-2.12  10/15/2015.  WBC 11.6 hemoglobin 12.7 platelets 202.  Sodium 137 potassium 4.4 BUN 17 creatinine 1.26.  TSH was 9.231 in the hospital     Assessment and Plan  History of right hip fracture-status post surgical repair- Patient will need continued PT and OT-as well as home health support and CNA to help with ADLs he still has significant weakness.  This was discussed with his brother who has discuss this with various parties including social work over the last few days-he is more comfortable with brother going home-he would like a hospice consult which I feel is quite warranted-patient does have a complicated history including a history of a pleural effusion with history of mesothelioma-this prognosis continues to be quite guarded and I feel he would probably be appropriate for hospice--and I feel family could benefit from the added support-again will await hospice input  #2 history of acute  thromboembolic CVA-presented with dysarthria and right facial weakness.  MRI showed multifocal stroke-no significant carotid stenosis-new onset A. fib.  Coumadin has been started INR is being monitored update INR is pending-this will have to be followed closely by home health with primary care provider notified of results for management of Coumadin.  Atrial fibrillation this appears to be rate controlled on Coumadin --hemoglobin of 12.2 on lab December 26 shows stability  Hypothyroidism this was discovered during the CVA workup-he has been started on Synthroid we will need a TSH within the next few weeks Synthroid was just recently started in the hospital.  History of BPH this appears stable on Proscar 5 mg a day.  History of ileus postop-this apparently has resolved.  History of second degree AV heart block-he did have a pacemaker placed this has been stable.  Hyperlipidemia this is thought to be stable he is on Lipitor 40 mg a day.  History of GERD he is on Protonix this appears not to have been an issue recently.  History of mesothelioma with history of malignant pleural effusion-as noted above I feel hospice consult is appropriate family could benefit from the added support as well.  [id number]

## 2016-01-02 ENCOUNTER — Non-Acute Institutional Stay (SKILLED_NURSING_FACILITY): Payer: Medicare Other | Admitting: Internal Medicine

## 2016-01-02 ENCOUNTER — Encounter: Payer: Self-pay | Admitting: Internal Medicine

## 2016-01-02 DIAGNOSIS — S72141D Displaced intertrochanteric fracture of right femur, subsequent encounter for closed fracture with routine healing: Secondary | ICD-10-CM

## 2016-01-02 DIAGNOSIS — I482 Chronic atrial fibrillation, unspecified: Secondary | ICD-10-CM

## 2016-01-02 DIAGNOSIS — I639 Cerebral infarction, unspecified: Secondary | ICD-10-CM

## 2016-01-02 DIAGNOSIS — J910 Malignant pleural effusion: Secondary | ICD-10-CM

## 2016-01-02 NOTE — Progress Notes (Signed)
Patient ID: Harold Daniels, male   DOB: Jan 22, 1941, 75 y.o.   MRN: [id number] Patient ID: Harold Daniels, male   DOB: 1941-08-13, 75 y.o.   MRN: [id number]  MRN: [id number] Name: Harold Daniels  Sex: male Age: 75 y.o. DOB: 09-30-1941  [place] #: [name] Facility/Room:506 Level Of Care: SNF Provider: Wille Celeste Emergency Contacts: Extended Emergency Contact Information Primary Emergency Contact: Contact,No  [place] Phone: [phone] Relation: None  Code Status:   Allergies: Review of patient's allergies indicates not on file.  Chief Complaint  Patient presents with  . Acute Visit    HPI: Patient is 75 y.o. male s/p mesothelioma, tx, HLD, and HTN who had a mechanical fall and broke his R hip. 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 These were treated with a pacemaker and anticoagulated with coumadin.Hospital course was also complicated by an ileus and new dx hypothyroidism. Pt was  admitted to SNF with generalized weakness and for OT/PT for his hip fx. While at SNF pt  followed for BPH, tx with proscar, HLD, tx with lipitor and GERD, tx with protonix. Apparently he has gotten somewhat stronger with therapy here continues to be quite weak. This discharge is been somewhat complicated-he has been safer discharge apparently couple times but has remained in the facility I believe secondary to concerns of adequate support at home-at one point hospice was considered as well.  He now will be going to an assisted living facility which I believe is safer than going home and his family is comfortable with this as well as brother who is very supportive is here today as well.     .   .  We will als       Past Medical History  Diagnosis Date  . Mesothelioma (pleural) ([place])   . Lumbar spinal  stenosis   . Anemia   . Hyperlipidemia   . Hypertension     No past surgical history on file.    Medication List       This list is accurate as of: 01/02/16 11:59 PM.  Always use your most recent med list.               acetaminophen 650 MG CR tablet  Commonly known as:  TYLENOL  Take 650 mg by mouth every 8 (eight) hours as needed for pain.     atorvastatin 40 MG tablet  Commonly known as:  LIPITOR  Take 40 mg by mouth daily.     docusate sodium 100 MG capsule  Commonly known as:  COLACE  Take 100 mg by mouth every other day.     ENSURE PLUS Liqd  Take 237 mLs by mouth 2 (two) times daily between meals.     finasteride 5 MG tablet  Commonly known as:  PROSCAR  Take 5 mg by mouth daily.     folic acid 1 MG tablet  Commonly known as:  FOLVITE  Take 1 mg by mouth daily.     levothyroxine 25 MCG tablet  Commonly known as:  SYNTHROID, LEVOTHROID  Take 25 mcg by mouth daily before breakfast.     mirtazapine 7.5 MG tablet  Commonly known as:  REMERON  Take 7.5 mg by mouth at bedtime.     multivitamin tablet  Take 1 tablet by mouth daily.  pantoprazole 40 MG tablet  Commonly known as:  PROTONIX  Take 40 mg by mouth daily.     sennosides-docusate sodium 8.6-50 MG tablet  Commonly known as:  SENOKOT-S  Take 2 tablets by mouth 2 (two) times daily.     traMADol 50 MG tablet  Commonly known as:  ULTRAM  Take 50 mg by mouth every 8 (eight) hours as needed.     warfarin 3 MG tablet  Commonly known as:  COUMADIN  Take 5 mg by mouth daily. Currently on hold            Social History  Substance Use Topics  . Smoking status: Unknown If Ever Smoked  . Smokeless tobacco: Not on file  . Alcohol Use: Not on file    Family history is + DM2. Breast CA   Review of Systems  DATA OBTAINED: from patient, nurse-brother GENERAL:  no fevers, fatigue, appetite changes SKIN: No itching, rash or wounds EYES: No eye pain, redness, discharge EARS: No earache,  tinnitus, change in hearing NOSE: No congestion, drainage or bleeding  MOUTH/THROAT: No mouth or tooth pain, No sore throat RESPIRATORY: No cough, wheezing, SOB CARDIAC: No chest pain, palpitations, lower extremity edema  GI: No abdominal pain, No N/V/D or constipation, No heartburn or reflux  GU: No dysuria, frequency or urgency, or incontinence  MUSCULOSKELETAL: No unrelieved bone/joint pain but continues to be quite weak NEUROLOGIC: No headache, dizziness or focal weakness--history of CVA with what appears to be  generalized weakness PSYCHIATRIC: No c/o anxiety or sadness   Filed Vitals:   01/02/16 2246  BP: 129/76  Pulse: 73  Temp: 98.1 F (36.7 C)  Resp: 18       Physical Exam  GENERAL APPEARANCE: Alert, conversant,  BM No acute distress appears to be quite frail.  SKIN: No diaphoresis rash; n HEAD: Normocephalic, atraumatic  EYES: Conjunctiva/lids clear. Pupils round, reactive. EOMs intact.  EARS: External exam WNL, canals clear. Hearing grossly normal.  NOSE: No deformity or discharge.  MOUTH/THROAT: Oropharynx clear mucous membranes moist she has numerous extractions  RESPIRATORY: Breathing is even, unlabored. Lung sounds are clear --shallow air entry  CARDIOVASCULAR: Heart RRR with some irregular beats no murmurs, rubs or gallops. No peripheral edema.   GASTROINTESTINAL: Abdomen is soft, non-tender, protuberant w/ normal bowel sounds.  MUSCULOSKELETAL: No abnormal joints or musculature--generalized frailty NEUROLOGIC:  Cranial nerves 2-12 grossly intact. Moves all extremities largely ambulates in a wheelchair PSYCHIATRIC: Mood and affect appropriate to situation, no behavioral issues pleasant and appropriate in conversation today  Patient Active Problem List   Diagnosis Date Noted  . Intertrochanteric fracture of right hip ([place]) 11/08/2015  . Acute thromboembolic cerebrovascular accident (CVA) ([place]) 11/08/2015  . Atrial fibrillation, new onset ([place]) 11/08/2015  .  Hypothyroidism 11/08/2015  . BPH (benign prostatic hypertrophy) 11/08/2015  . Ileus, postoperative 11/08/2015  . Heart block AV second degree 11/08/2015  . GERD (gastroesophageal reflux disease) 11/08/2015  . Malignant pleural effusion 06/27/2015  . Hematemesis 06/27/2015  . Mesothelioma (pleural) ([place])   . Lumbar spinal stenosis   . Acute blood loss anemia   . Hyperlipidemia   . Hypertension     Labs  12/23/2015.  Sodium 139 potassium 3.6 BUN 9 creatinine 1.0.  Albumin 3.0-bilirubin 0.2-otherwise liver function tests within normal limits.  12- 26 2016.  WBC 5.5 hemoglobin 12.2 platelets 218.  TSH-2.12  10/15/2015.  WBC 11.6 hemoglobin 12.7 platelets 202.  Sodium 137 potassium 4.4 BUN 17 creatinine 1.26.  TSH was  9.231 in the hospital     Assessment and Plan  History of right hip fracture-status post surgical repair- Patient would benefit from continued PT and OT-continues to have significant weakness   -patient does have a complicated history including a history of a pleural effusion with history of mesothelioma-this prognosis continues to be quite guarded and I feel he would probably be appropriate for hospice this will have to be followed up at his new facility.  -   #2 history of acute thromboembolic CVA-presented with dysarthria and right facial weakness.  MRI showed multifocal stroke-no significant carotid stenosis-new onset A. fib.   He had been on Coumadin 5 mg a day this is currently being held secondary to supratherapeutic INR-update INR is pending-this will be drawn tomorrow at the new facility and they are aware of this-this will have to be followed up by primary care provider-he is followed by numerous physicians at [facility]- It appears Dr. Calvert Cantor of internal medicine is coordinating his care-and family will be in contact with him as well to make sure there is follow-up.    Atrial fibrillation this appears to be  rate controlled on Coumadin --hemoglobin of 12.2 on lab December 26 shows stability  Hypothyroidism this was discovered during the CVA workup-he has been started on Synthroid we will need a TSH within the next few weeks Synthroid was just recently started in the hospital.  History of BPH this appears stable on Proscar 5 mg a day.  History of ileus postop-this apparently has resolved.  History of second degree AV heart block-he did have a pacemaker placed this has been stable.  Hyperlipidemia this is thought to be stable he is on Lipitor 40 mg a day.  History of GERD he is on Protonix this appears not to have been an issue recently.  History of mesothelioma with history of malignant pleural effusion-as noted above History hypothyroidism he continues on Synthroid TSH was 2.12 on 12/19/2015  [id number]-

## 2016-01-14 ENCOUNTER — Emergency Department (HOSPITAL_BASED_OUTPATIENT_CLINIC_OR_DEPARTMENT_OTHER)
Admission: EM | Admit: 2016-01-14 | Discharge: 2016-01-14 | Disposition: A | Discharge status: Home / Self Care | Payer: Medicare Other | Attending: Emergency Medicine | Admitting: Emergency Medicine

## 2016-01-14 ENCOUNTER — Encounter (HOSPITAL_BASED_OUTPATIENT_CLINIC_OR_DEPARTMENT_OTHER): Payer: Self-pay | Admitting: *Deleted

## 2016-01-14 ENCOUNTER — Emergency Department (HOSPITAL_BASED_OUTPATIENT_CLINIC_OR_DEPARTMENT_OTHER): Payer: Medicare Other

## 2016-01-14 DIAGNOSIS — Z8781 Personal history of (healed) traumatic fracture: Secondary | ICD-10-CM | POA: Insufficient documentation

## 2016-01-14 DIAGNOSIS — I10 Essential (primary) hypertension: Secondary | ICD-10-CM | POA: Insufficient documentation

## 2016-01-14 DIAGNOSIS — E785 Hyperlipidemia, unspecified: Secondary | ICD-10-CM | POA: Insufficient documentation

## 2016-01-14 DIAGNOSIS — Z79899 Other long term (current) drug therapy: Secondary | ICD-10-CM | POA: Diagnosis not present

## 2016-01-14 DIAGNOSIS — W010XXA Fall on same level from slipping, tripping and stumbling without subsequent striking against object, initial encounter: Secondary | ICD-10-CM | POA: Insufficient documentation

## 2016-01-14 DIAGNOSIS — Y9289 Other specified places as the place of occurrence of the external cause: Secondary | ICD-10-CM | POA: Insufficient documentation

## 2016-01-14 DIAGNOSIS — S63501A Unspecified sprain of right wrist, initial encounter: Secondary | ICD-10-CM | POA: Insufficient documentation

## 2016-01-14 DIAGNOSIS — D649 Anemia, unspecified: Secondary | ICD-10-CM | POA: Insufficient documentation

## 2016-01-14 DIAGNOSIS — Y998 Other external cause status: Secondary | ICD-10-CM | POA: Insufficient documentation

## 2016-01-14 DIAGNOSIS — Y9389 Activity, other specified: Secondary | ICD-10-CM | POA: Insufficient documentation

## 2016-01-14 DIAGNOSIS — Z8739 Personal history of other diseases of the musculoskeletal system and connective tissue: Secondary | ICD-10-CM | POA: Diagnosis not present

## 2016-01-14 DIAGNOSIS — W19XXXA Unspecified fall, initial encounter: Secondary | ICD-10-CM

## 2016-01-14 DIAGNOSIS — S6991XA Unspecified injury of right wrist, hand and finger(s), initial encounter: Secondary | ICD-10-CM | POA: Diagnosis present

## 2016-01-14 DIAGNOSIS — Z7901 Long term (current) use of anticoagulants: Secondary | ICD-10-CM | POA: Diagnosis not present

## 2016-01-14 NOTE — ED Provider Notes (Signed)
CSN: [id number]     Arrival date & time 01/14/16  [id number] History   First MD Initiated Contact with Patient 01/14/16 1040     Chief Complaint  Patient presents with  . Fall     (Consider location/radiation/quality/duration/timing/severity/associated sxs/prior Treatment) HPI Comments: 14 AM fell down No LOC 1 hr ago started having pain in right wrist/hand, in between 2 fingers No new weakness/numbness   Hx of mesothelioma, broken hip, working on strength   Patient is a 75 y.o. male presenting with fall.  Fall Pertinent negatives include no chest pain, no abdominal pain, no headaches and no shortness of breath.    Past Medical History  Diagnosis Date  . Mesothelioma (pleural) ([place])   . Lumbar spinal stenosis   . Anemia   . Hyperlipidemia   . Hypertension    Past Surgical History  Procedure Laterality Date  . Hip surgery Right    No family history on file. Social History  Substance Use Topics  . Smoking status: Unknown If Ever Smoked  . Smokeless tobacco: None  . Alcohol Use: Yes    Review of Systems  Constitutional: Negative for fever.  HENT: Negative for sore throat.   Eyes: Negative for visual disturbance.  Respiratory: Negative for shortness of breath.   Cardiovascular: Negative for chest pain.  Gastrointestinal: Negative for nausea, vomiting and abdominal pain.  Genitourinary: Negative for difficulty urinating.  Musculoskeletal: Positive for arthralgias. Negative for back pain and neck stiffness.  Skin: Negative for rash.  Neurological: Negative for dizziness, syncope, facial asymmetry, weakness (no change ,bilateral lower ext weakness since surgery in rehab), light-headedness, numbness and headaches.      Allergies  Review of patient's allergies indicates no known allergies.  Home Medications   Prior to Admission medications   Medication Sig Start Date End Date Taking? Authorizing Provider  acetaminophen (TYLENOL) 650 MG CR tablet Take 650 mg by  mouth every 8 (eight) hours as needed for pain.    Historical Provider, MD  atorvastatin (LIPITOR) 40 MG tablet Take 40 mg by mouth daily.    Historical Provider, MD  docusate sodium (COLACE) 100 MG capsule Take 100 mg by mouth every other day.    Historical Provider, MD  ENSURE PLUS (ENSURE PLUS) LIQD Take 237 mLs by mouth 2 (two) times daily between meals.    Historical Provider, MD  finasteride (PROSCAR) 5 MG tablet Take 5 mg by mouth daily.    Historical Provider, MD  folic acid (FOLVITE) 1 MG tablet Take 1 mg by mouth daily.    Historical Provider, MD  levothyroxine (SYNTHROID, LEVOTHROID) 25 MCG tablet Take 25 mcg by mouth daily before breakfast.    Historical Provider, MD  mirtazapine (REMERON) 7.5 MG tablet Take 7.5 mg by mouth at bedtime.    Historical Provider, MD  Multiple Vitamin (MULTIVITAMIN) tablet Take 1 tablet by mouth daily.    Historical Provider, MD  pantoprazole (PROTONIX) 40 MG tablet Take 40 mg by mouth daily.    Historical Provider, MD  sennosides-docusate sodium (SENOKOT-S) 8.6-50 MG tablet Take 2 tablets by mouth 2 (two) times daily.    Historical Provider, MD  traMADol (ULTRAM) 50 MG tablet Take 50 mg by mouth every 8 (eight) hours as needed.    Historical Provider, MD  warfarin (COUMADIN) 3 MG tablet Take 5 mg by mouth daily. Currently on hold    Historical Provider, MD   BP 130/97 mmHg  Pulse 87  Temp(Src) 97.8 F (36.6 C) (Oral)  Resp 18  Ht 6\' 6"  (1.981 m)  Wt 193 lb (87.544 kg)  BMI 22.31 kg/m2  SpO2 98% Physical Exam  Constitutional: He is oriented to person, place, and time. He appears well-developed and well-nourished. No distress.  HENT:  Head: Normocephalic and atraumatic.  Eyes: Conjunctivae and EOM are normal.  Neck: Normal range of motion.  Cardiovascular: Normal rate, regular rhythm, normal heart sounds and intact distal pulses.  Exam reveals no gallop and no friction rub.   No murmur heard. Pulmonary/Chest: Effort normal and breath sounds  normal. No respiratory distress. He has no wheezes. He has no rales.  Abdominal: Soft. He exhibits no distension. There is no tenderness. There is no guarding.  Musculoskeletal: He exhibits no edema.       Right wrist: He exhibits tenderness and bony tenderness. He exhibits normal range of motion, no swelling, no crepitus, no deformity and no laceration.       Right hand: He exhibits tenderness (right ulnar metacarpal pain) and bony tenderness. He exhibits normal range of motion, normal capillary refill, no deformity, no laceration and no swelling. Normal sensation noted. Normal strength noted. He exhibits no finger abduction and no wrist extension trouble.  Neurological: He is alert and oriented to person, place, and time. No cranial nerve deficit or sensory deficit. Coordination normal. GCS eye subscore is 4. GCS verbal subscore is 5. GCS motor subscore is 6.  Weakness RLE, since hip fx per pt, unchanged from baseline L side mild weakness, unchanged from baseline since surgery per pt and son Strength otherwise WNL   Skin: Skin is warm and dry. He is not diaphoretic.  Nursing note and vitals reviewed.   ED Course  Procedures (including critical care time) Labs Review Labs Reviewed - No data to display  Imaging Review Dg Wrist Complete Right  01/14/2016  CLINICAL DATA:  Fall on outstretched hand with wrist pain, initial encounter EXAM: RIGHT WRIST - COMPLETE 3+ VIEW COMPARISON:  None. FINDINGS: There is no evidence of fracture or dislocation. There is no evidence of arthropathy or other focal bone abnormality. Soft tissues are unremarkable. IMPRESSION: No acute abnormality noted. Electronically Signed   By: Inez Catalina M.D.   On: 01/14/2016 11:46   Dg Hand Complete Right  01/14/2016  CLINICAL DATA:  Recent fall on outstretched hand with fourth digit pain, initial encounter EXAM: RIGHT HAND - COMPLETE 3+ VIEW COMPARISON:  None. FINDINGS: No acute fracture or dislocation is noted. Mild  degenerative changes are noted in the interphalangeal joints. No gross soft tissue abnormality is seen. IMPRESSION: Mild degenerative changes without acute bony abnormality. Electronically Signed   By: Inez Catalina M.D.   On: 01/14/2016 11:56   I have personally reviewed and evaluated these images and lab results as part of my medical decision-making.   EKG Interpretation None      MDM   Final diagnoses:  Fall, initial encounter  Right wrist sprain, initial encounter   75 yo male with history of mesothelioma, hypertension, hyperlipidemia, lumbar spinal stenosis, recent right hip fracture s/p surgical repair currently in rehab presents with concern for right wrist/hand pain after fall from standing. Patient is in rehab and reports he has been slowly gaining strength and today when he woke up he felt his right leg go out.  Neurologic exam unchanged from current baseline per patient and son and doubt CVA. Patient denies any head trauma, LOC, neck pain and have low suspicion for intracranial or spinal injury. Denies pain in other locations and doubt  other injuries. Patient is afebrile at baseline, no lightheadedness, no ifnectous concerns.  Initial triage blood pressure 93/80, feel this is likely inaccurate with repeat BP [id number] systolic and patient without symptoms to suggest low blood pressures.  Patient chief concern is right wrist and hand pain.  Patient neurovascularly intact with tenderness to ulnar side of hand and wrist.  XR performed showing no sign of fracture or dislocation. Likely wrist sprain.  Discussed rest, ice, heat, tylenol prn. Patient discharged in stable condition with understanding of reasons to return.    Gareth Morgan, MD 01/14/16 2049

## 2016-01-14 NOTE — Discharge Instructions (Signed)

## 2016-01-14 NOTE — ED Notes (Signed)
Patient states he tripped and  fell on R wrist this morning around 4:30. C/O R hand and wrist pain. No LOC

## 2017-08-07 IMAGING — CR DG HAND COMPLETE 3+V*R*
3 series · 3 of 3 positions shown · non-contrast
Comparison: None.

CLINICAL DATA: Recent fall on outstretched hand with fourth digit
pain, initial encounter

EXAM:
RIGHT HAND - COMPLETE 3+ VIEW

[x hand pa right]
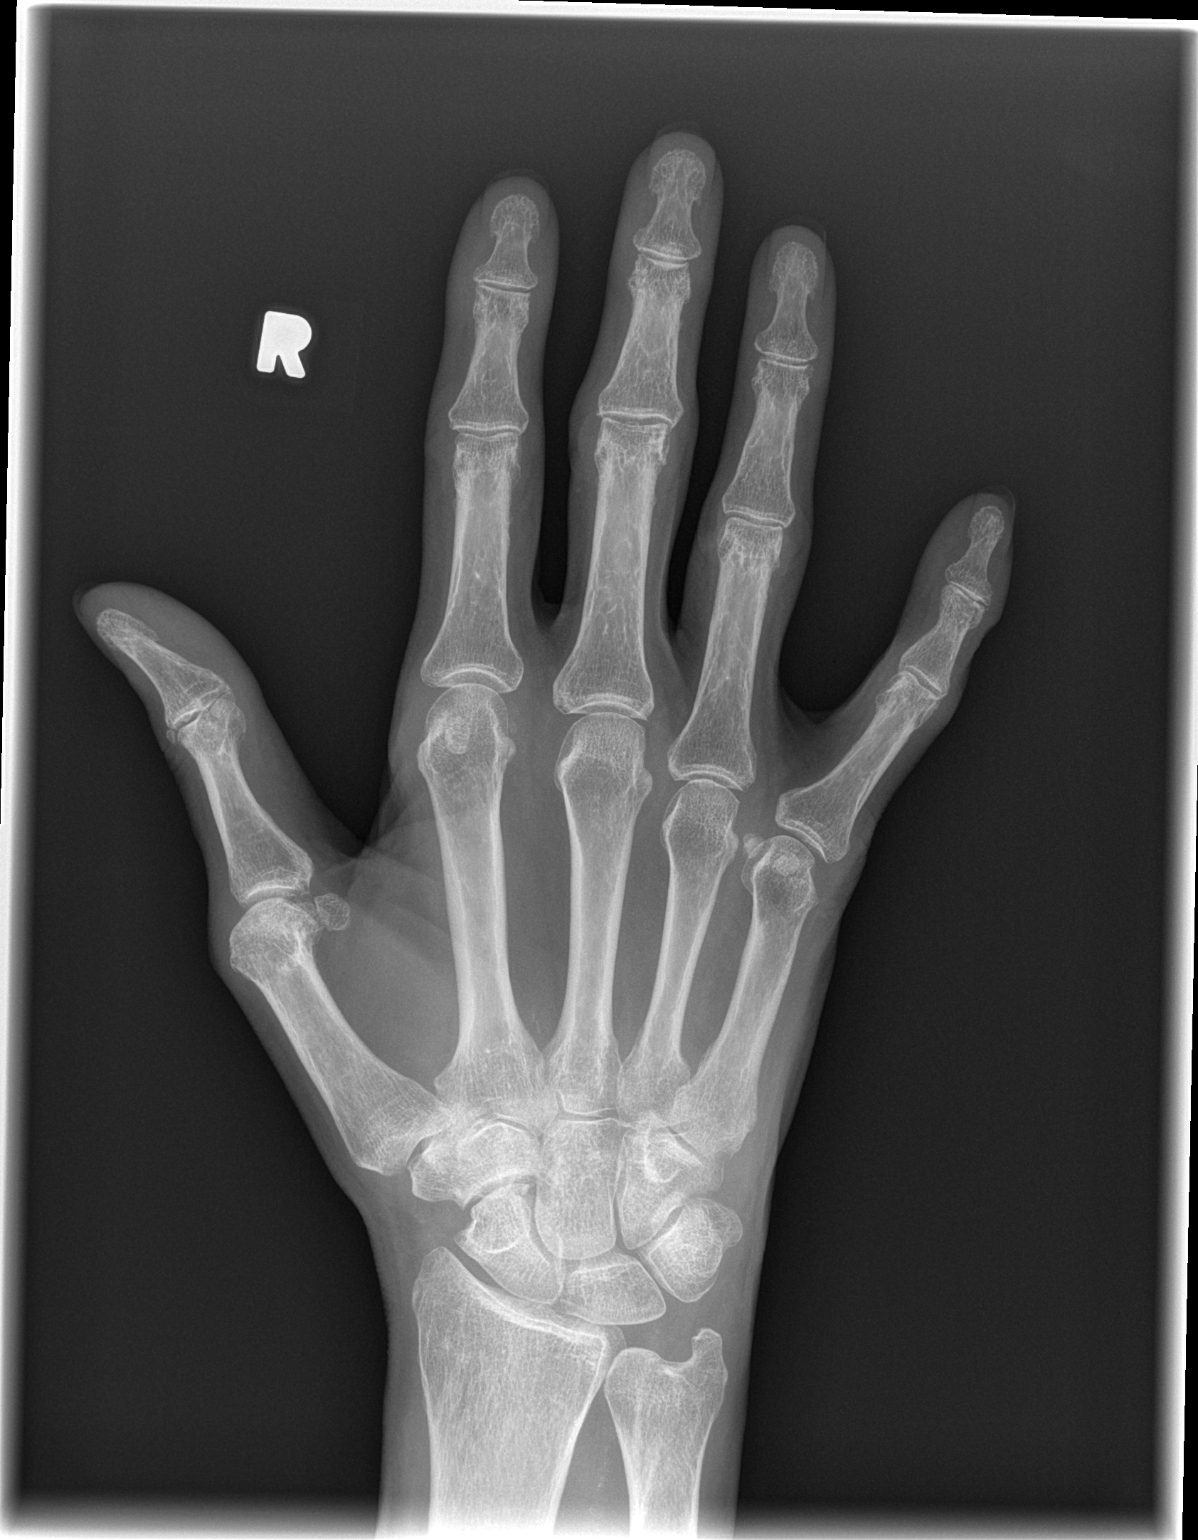

[x hand oblique right]
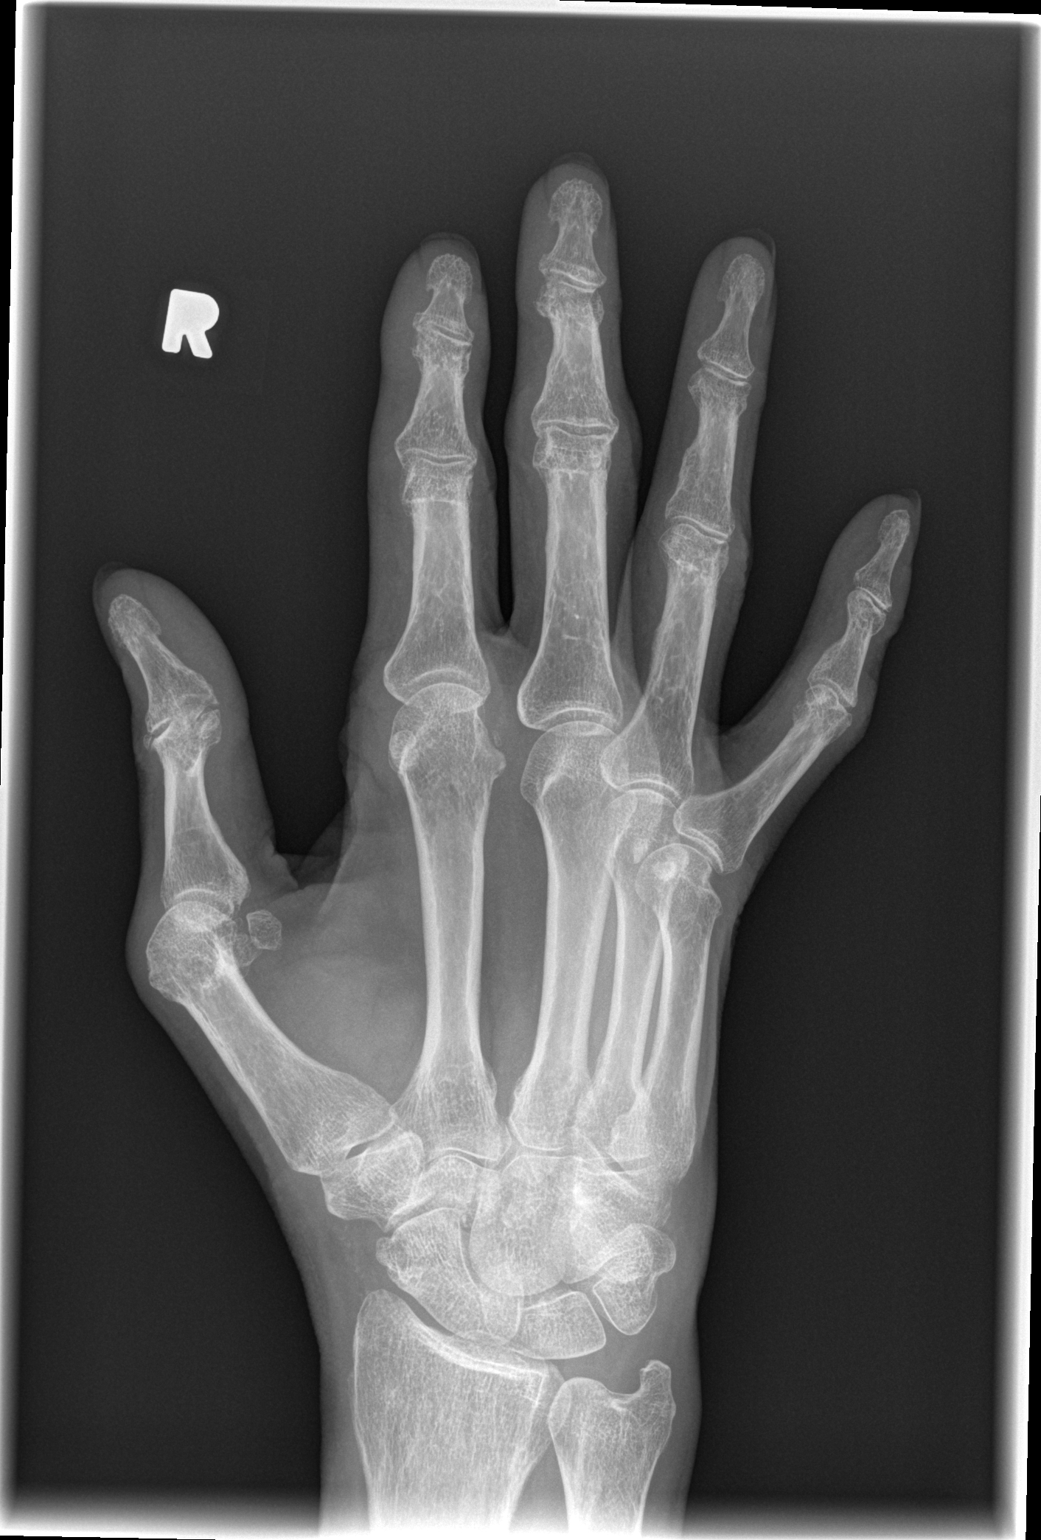

[x hand lat right]
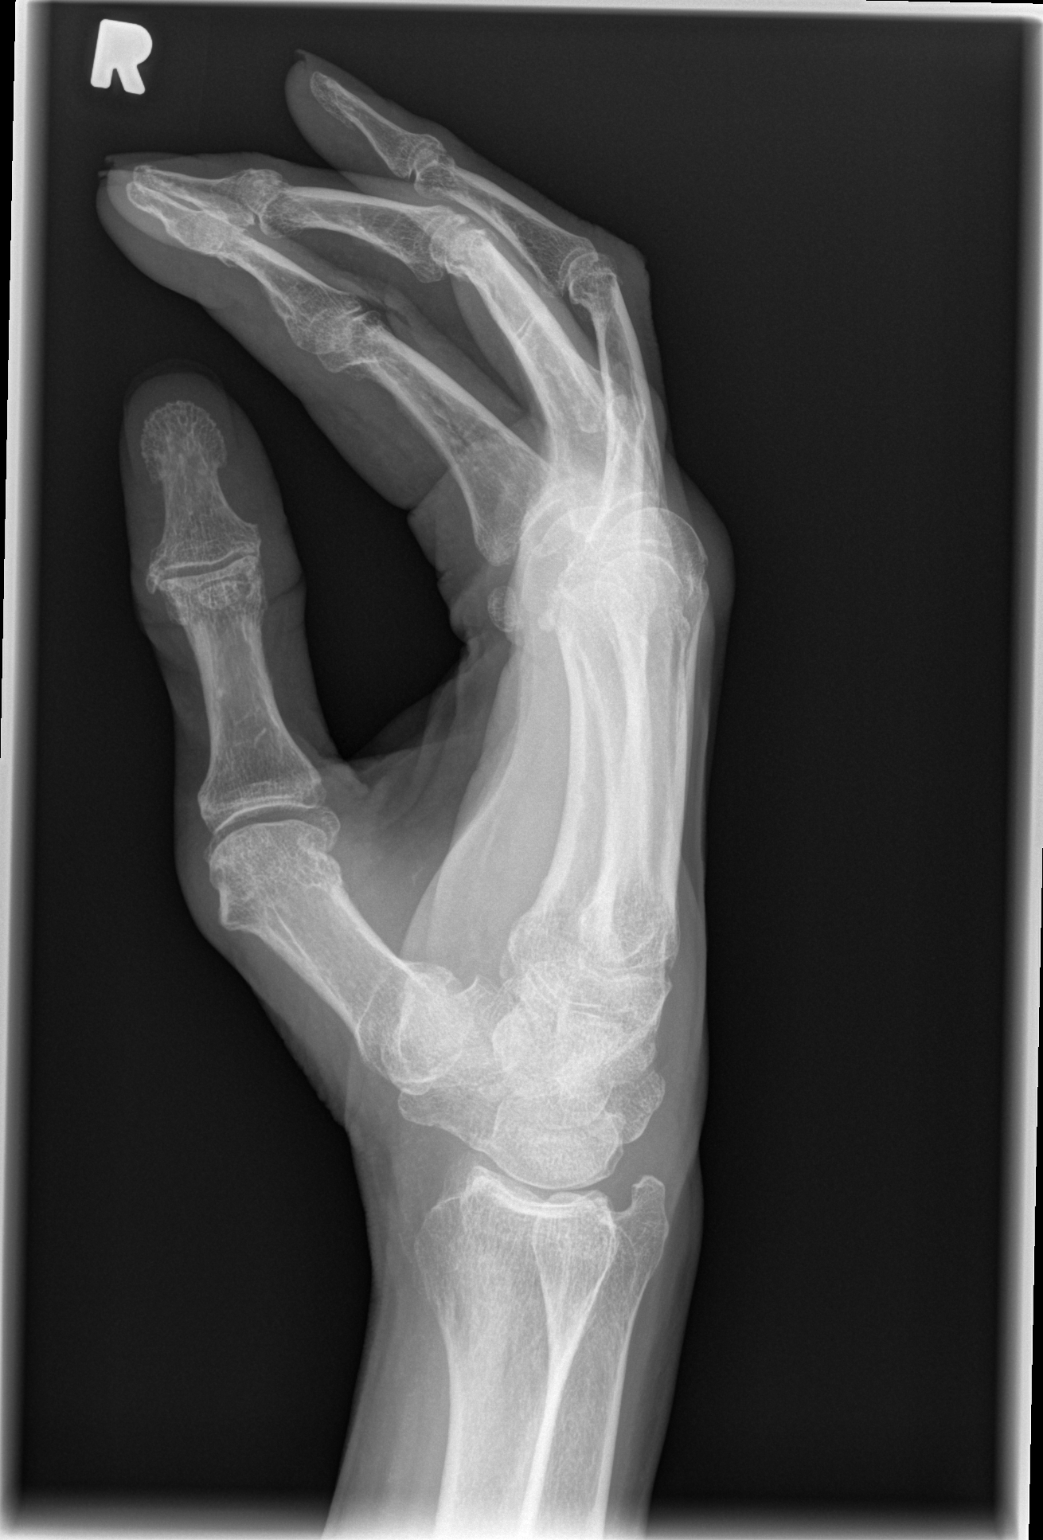

[3 of 3 positions shown; findings below may reference images not displayed]

FINDINGS: No acute fracture or dislocation is noted. Mild degenerative changes
are noted in the interphalangeal joints. No gross soft tissue
abnormality is seen.
IMPRESSION: Mild degenerative changes without acute bony abnormality.

## 2018-08-24 DEATH — deceased
